# Patient Record
Sex: Female | Born: 1960 | Race: White | Hispanic: No | Marital: Single | Smoking: Never smoker
Health system: Southern US, Community
[De-identification: ages and names within clinical notes are randomized; demographics above are authoritative.]

## PROBLEM LIST (undated history)

## (undated) DIAGNOSIS — K219 Gastro-esophageal reflux disease without esophagitis: Secondary | ICD-10-CM

## (undated) HISTORY — DX: Gastro-esophageal reflux disease without esophagitis: K21.9

---

## 2009-07-25 ENCOUNTER — Ambulatory Visit (HOSPITAL_COMMUNITY): Admission: RE | Admit: 2009-07-25 | Discharge: 2009-07-25 | Payer: Self-pay | Admitting: Family Medicine

## 2011-04-04 IMAGING — MG MM DIGITAL SCREENING BILAT
4 series · 4 of 4 positions shown · non-contrast
Comparison: none

DG SCREEN MAMMOGRAM BILATERAL
Bilateral CC and MLO view(s) were taken.
Technologist: Lavren Zh, RT RM

DIGITAL SCREENING MAMMOGRAM WITH CAD:
There are scattered fibroglandular densities.  No masses or malignant type calcifications are 
identified.
Images were processed with CAD.

[R CC]
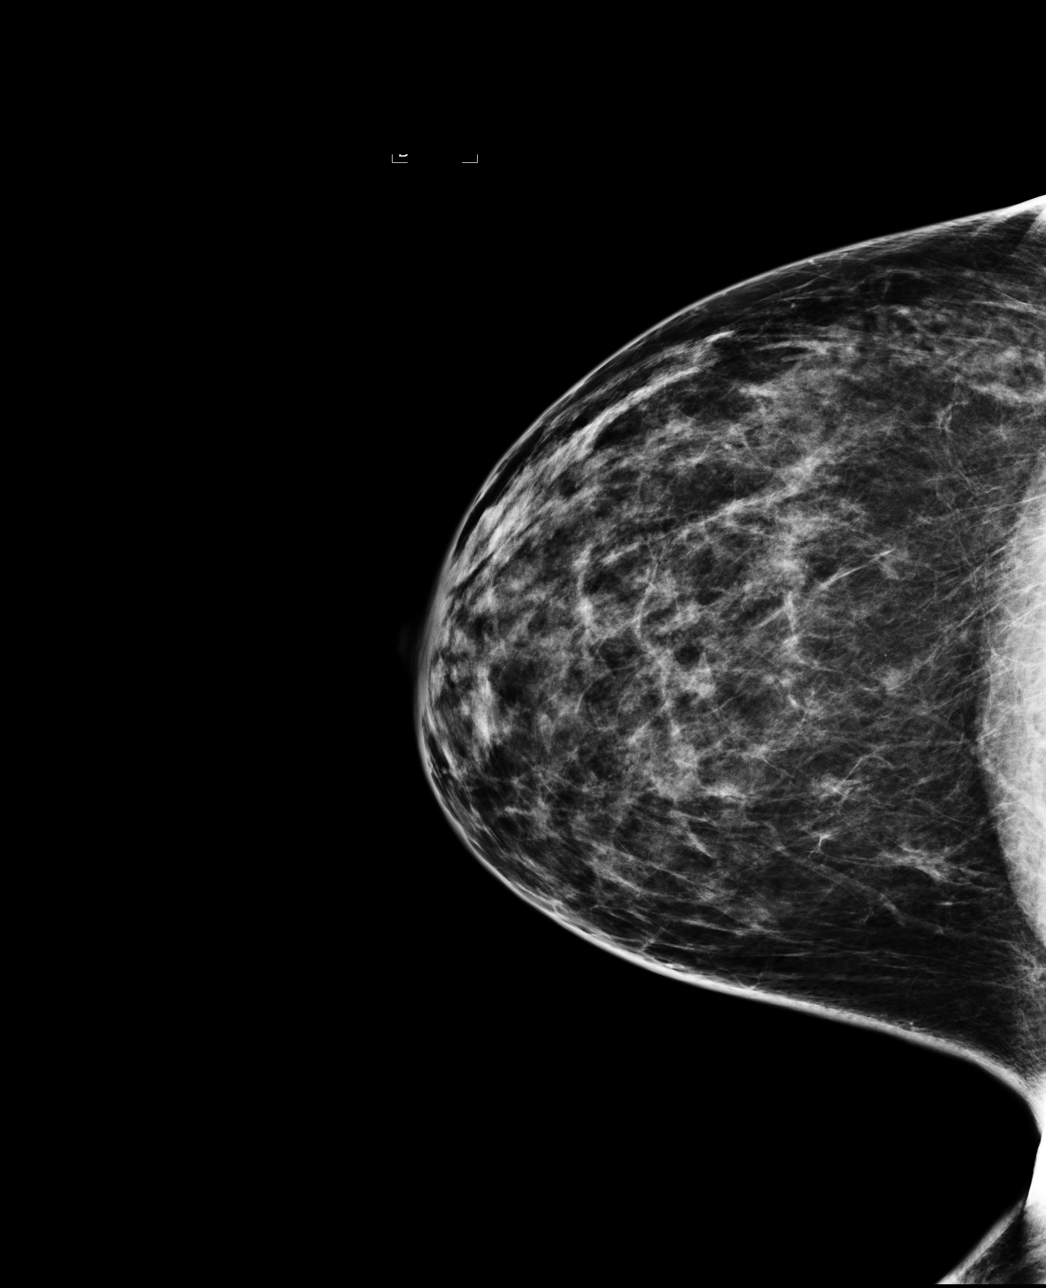

[R MLO]
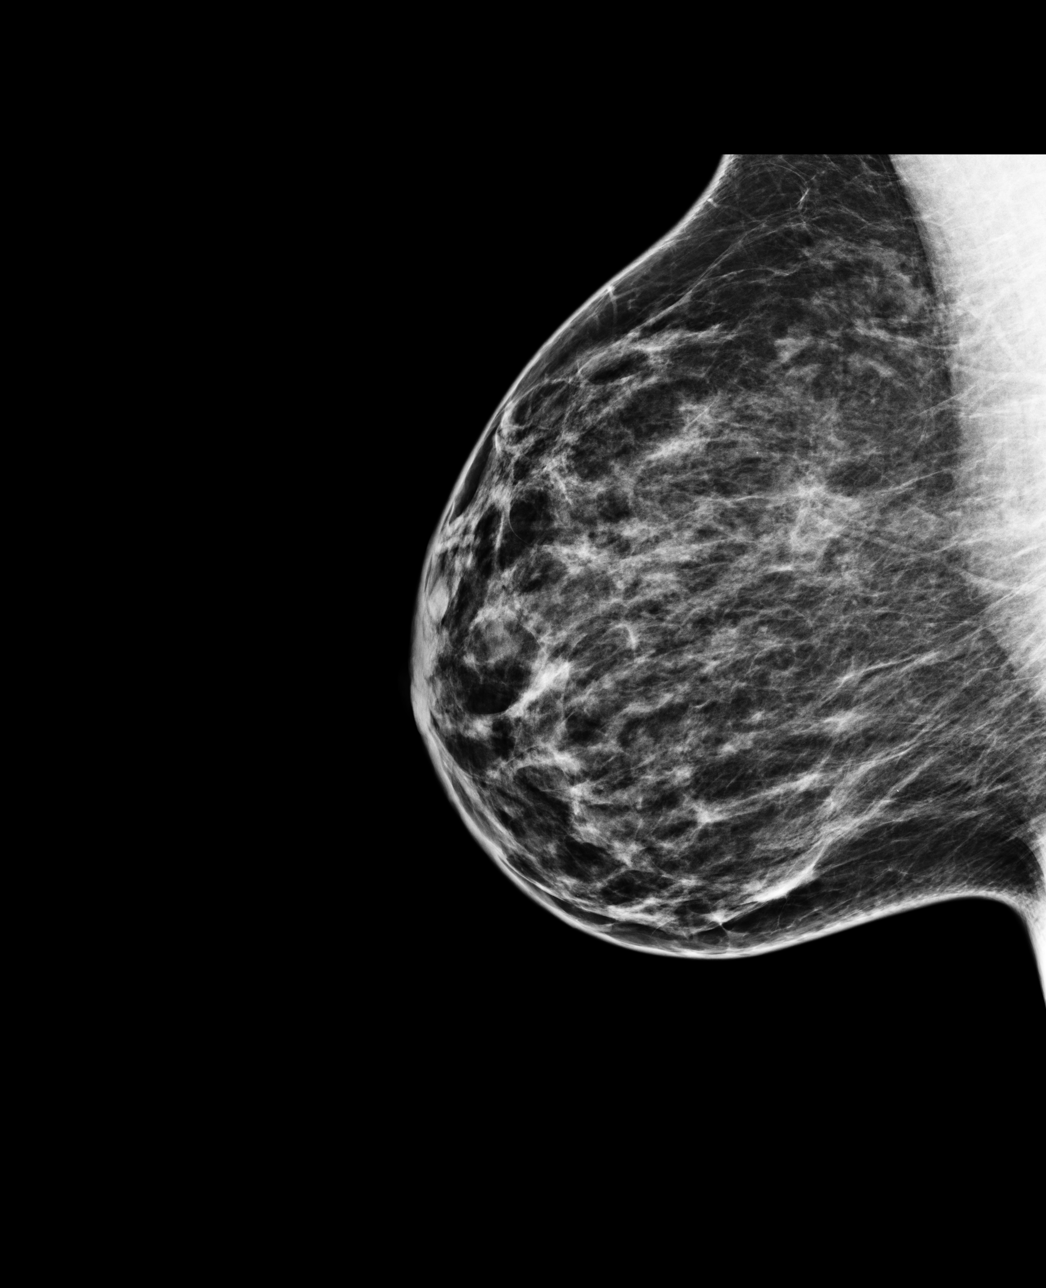

[L CC]
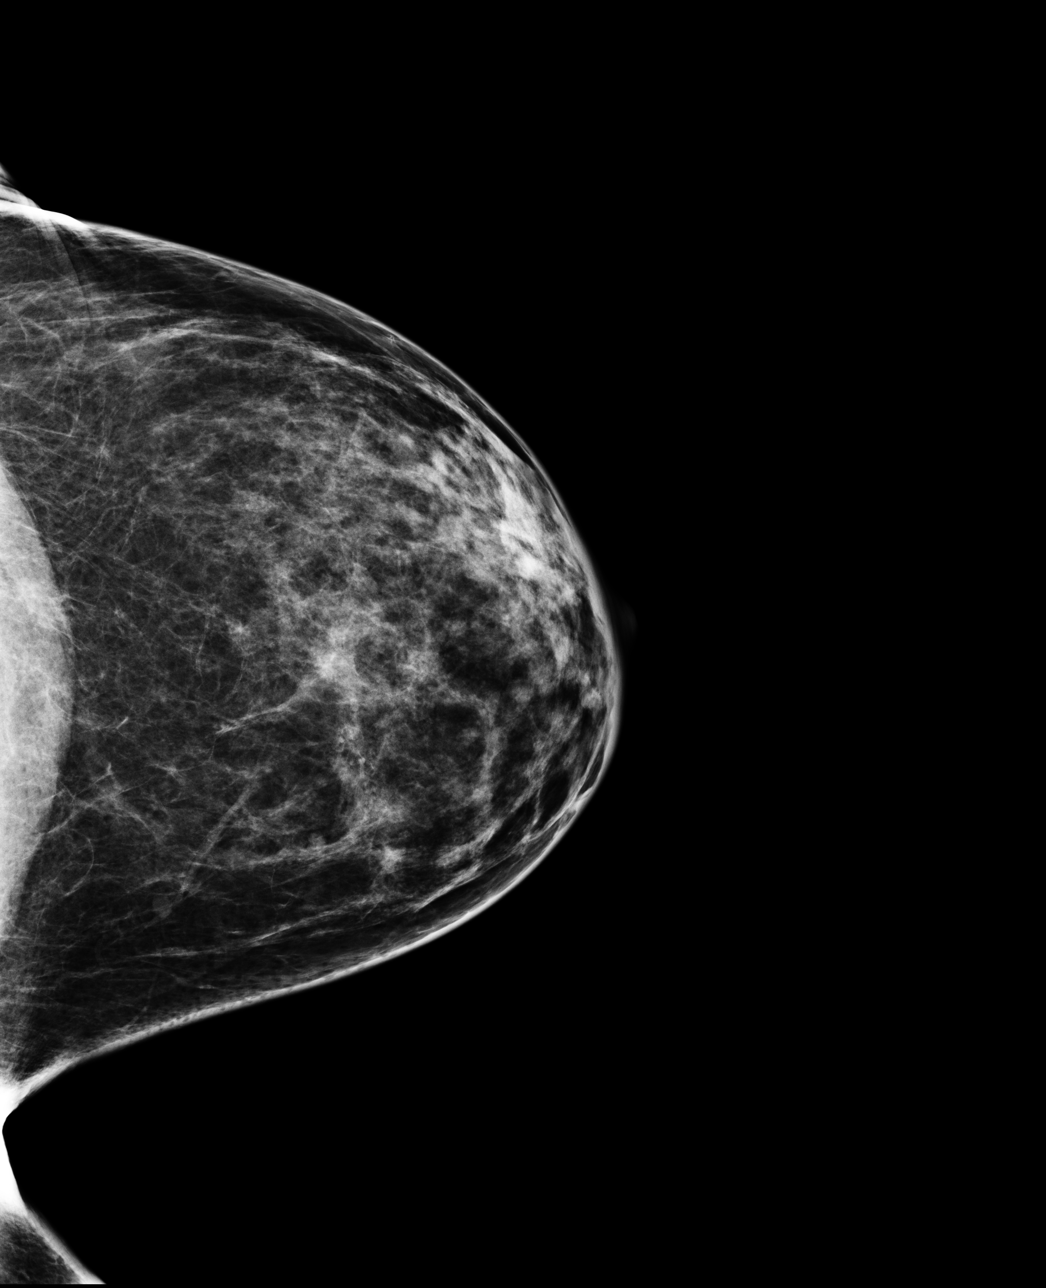

[L MLO]
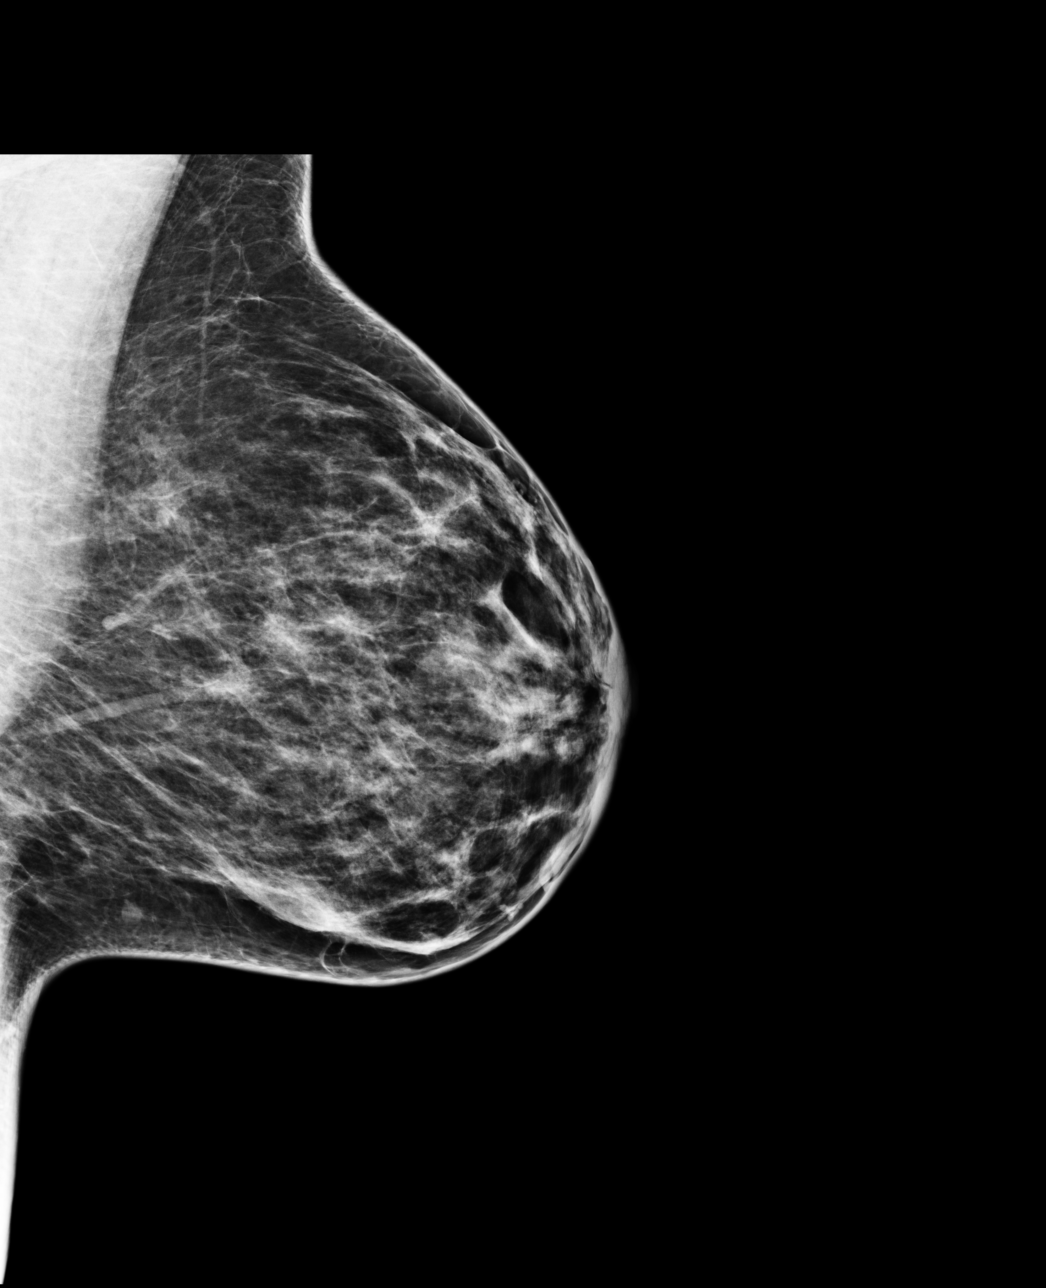

[4 of 4 positions shown; findings below may reference images not displayed]

IMPRESSION: No specific mammographic evidence of malignancy.  Next screening mammogram is recommended in one 
year.

A result letter of this screening mammogram will be mailed directly to the patient.

ASSESSMENT: Negative - BI-RADS 1

Screening mammogram in 1 year.
,

## 2016-01-07 ENCOUNTER — Other Ambulatory Visit: Payer: Self-pay | Admitting: Allergy and Immunology

## 2016-02-04 ENCOUNTER — Ambulatory Visit (INDEPENDENT_AMBULATORY_CARE_PROVIDER_SITE_OTHER): Admitting: Allergy and Immunology

## 2016-02-04 ENCOUNTER — Encounter: Payer: Self-pay | Admitting: Allergy and Immunology

## 2016-02-04 VITALS — BP 152/84 | HR 84 | Temp 98.6°F | Resp 20 | Ht 62.99 in | Wt 196.2 lb

## 2016-02-04 DIAGNOSIS — J387 Other diseases of larynx: Secondary | ICD-10-CM

## 2016-02-04 DIAGNOSIS — K219 Gastro-esophageal reflux disease without esophagitis: Secondary | ICD-10-CM

## 2016-02-04 NOTE — Patient Instructions (Signed)
  1. Continue Dexilant 60 mg in a.m.  2. Attempt to discontinue her ranitidine in evening  3. Continue to consolidate caffeine consumption  4. Return to clinic in 1 year or earlier if problem

## 2016-02-04 NOTE — Progress Notes (Signed)
Follow-up Note  Referring Provider: No ref. provider found Primary Provider: Rosario Adie, MD Date of Office Visit: 02/04/2016  Subjective:   Meredith Sharp (DOB: January 01, 1961) is a 55 y.o. female who [facility] in re-evaluation of the following:  HPI Comments: Meredith Sharp returns to this clinic in reevaluation of her LPR. While utilizing a combination of Dexilant in the morning and ranitidine in the evening and consolidating her caffeine consumption to the point where she is only drinking one mug of half caffeinated coffee in the morning she has had an excellent response with very little issues revolving around her coughing or her throat.   Current outpatient prescriptions:  .  dexlansoprazole (DEXILANT) 60 MG capsule, Take 1 tablet by mouth every morning., Disp: , Rfl:  .  venlafaxine (EFFEXOR) 75 MG tablet, Take 75 mg by mouth daily., Disp: , Rfl:  .  ranitidine (ZANTAC) 300 MG tablet, TAKE 1 TABLET EVERY EVENING, Disp: 90 tablet, Rfl: 3   Past Medical History  Diagnosis Date  . GERD (gastroesophageal reflux disease)     History reviewed. No pertinent past surgical history.  No Known Allergies  Review of systems negative except as noted in HPI / PMHx or noted below:  Review of Systems  Constitutional: Negative.   HENT: Negative.   Eyes: Negative.   Respiratory: Negative.   Cardiovascular: Negative.   Gastrointestinal: Negative.   Genitourinary: Negative.   Musculoskeletal: Negative.   Skin: Negative.   Neurological: Negative.   Endo/Heme/Allergies: Negative.   Psychiatric/Behavioral: Negative.      Objective:   Filed Vitals:   02/04/16 1537  BP: 152/84  Pulse: 84  Temp: 98.6 F (37 C)  Resp: 20   Height: 5' 2.99" (160 cm)  Weight: 196 lb 3.4 oz (89 kg)   Physical Exam  Constitutional: She is well-developed, well-nourished, and in no distress.  HENT:  Head: Normocephalic.  Right Ear: Tympanic membrane,  external ear and ear canal normal.  Left Ear: Tympanic membrane, external ear and ear canal normal.  Nose: Nose normal. No mucosal edema or rhinorrhea.  Mouth/Throat: Uvula is midline, oropharynx is clear and moist and mucous membranes are normal. No oropharyngeal exudate.  Eyes: Conjunctivae are normal.  Neck: Trachea normal. No tracheal tenderness present. No tracheal deviation present. No thyromegaly present.  Cardiovascular: Normal rate, regular rhythm, S1 normal, S2 normal and normal heart sounds.   No murmur heard. Pulmonary/Chest: Breath sounds normal. No stridor. No respiratory distress. She has no wheezes. She has no rales.  Musculoskeletal: She exhibits no edema.  Lymphadenopathy:       Head (right side): No tonsillar adenopathy present.       Head (left side): No tonsillar adenopathy present.    She has no cervical adenopathy.    She has no axillary adenopathy.  Neurological: She is alert. Gait normal.  Skin: No rash noted. She is not diaphoretic. No erythema. Nails show no clubbing.  Psychiatric: Mood and affect normal.    Diagnostics: None  Assessment and Plan:   1. LPRD (laryngopharyngeal reflux disease)     1. Continue Dexilant 60 mg in a.m.  2. Attempt to discontinue her ranitidine in evening  3. Continue to consolidate caffeine consumption  4. Return to clinic in 1 year or earlier if problem  Meredith Sharp is doing wonderful and I see no need to change her medical therapy other than to attempt to have her eliminate her ranitidine in the  evening. I will see her back in this clinic in 1 year or earlier if a problem.  Meredith Schimke, MD [facility]

## 2016-04-20 ENCOUNTER — Other Ambulatory Visit: Payer: Self-pay | Admitting: Allergy and Immunology
